# Patient Record
Sex: Male | Born: 1985 | Race: White | Hispanic: No | Marital: Married | Smoking: Never smoker
Health system: Southern US, Community
[De-identification: ages and names within clinical notes are randomized; demographics above are authoritative.]

## PROBLEM LIST (undated history)

## (undated) ENCOUNTER — Ambulatory Visit: Admission: EM | Payer: BC Managed Care – PPO

## (undated) DIAGNOSIS — T7840XA Allergy, unspecified, initial encounter: Secondary | ICD-10-CM

## (undated) DIAGNOSIS — Z8619 Personal history of other infectious and parasitic diseases: Secondary | ICD-10-CM

## (undated) HISTORY — PX: NO PAST SURGERIES: SHX2092

## (undated) HISTORY — DX: Personal history of other infectious and parasitic diseases: Z86.19

## (undated) HISTORY — DX: Allergy, unspecified, initial encounter: T78.40XA

---

## 2013-03-30 ENCOUNTER — Other Ambulatory Visit: Payer: Self-pay | Admitting: Occupational Medicine

## 2013-03-30 ENCOUNTER — Ambulatory Visit: Payer: Self-pay

## 2013-03-30 DIAGNOSIS — Z0000 Encounter for general adult medical examination without abnormal findings: Secondary | ICD-10-CM

## 2015-04-07 IMAGING — CR DG CHEST 1V
1 series · 1 of 1 positions shown · non-contrast
Comparison: None.

CLINICAL DATA: Pre-employment physical examination, nonsmoker,
initial encounter.

CHEST - 1 VIEW

[view not recorded]
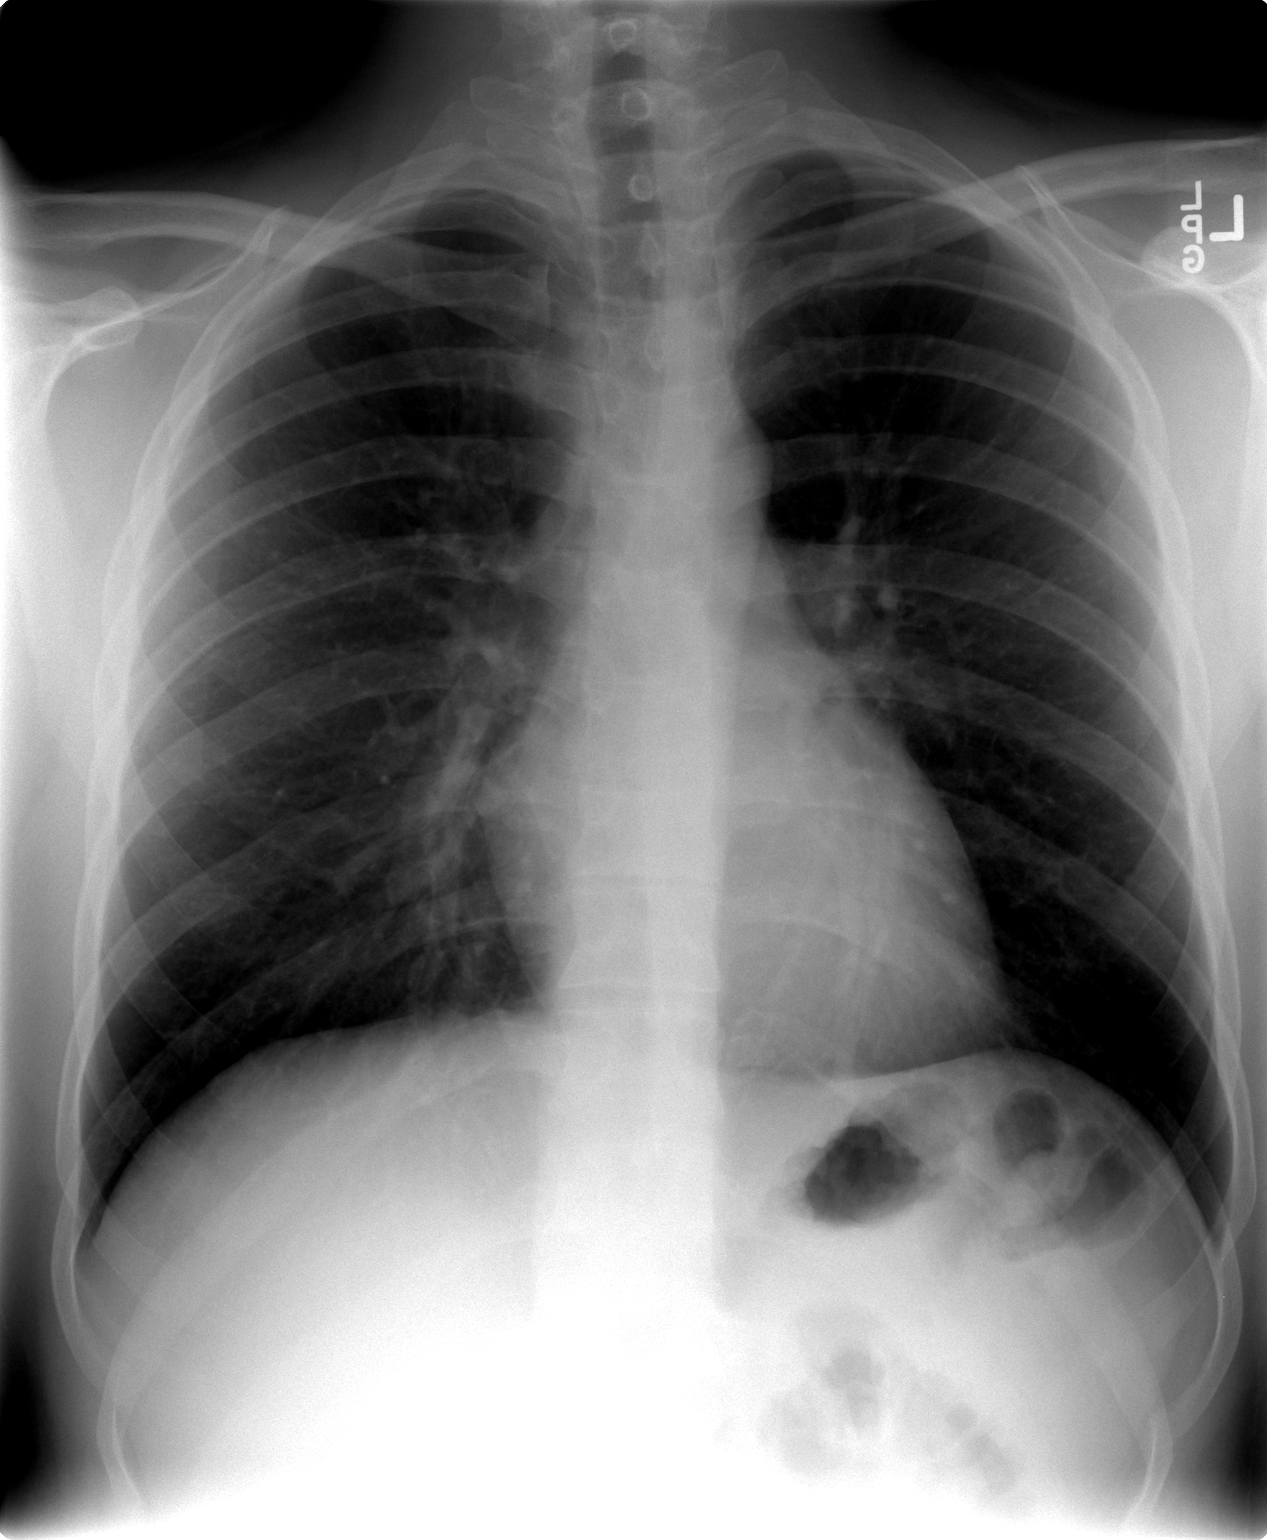

[1 of 1 positions shown; findings below may reference images not displayed]

FINDINGS: Normal cardiac silhouette and mediastinal contours.  No focal
parenchymal opacities.  Pleural effusion pneumothorax.  No evidence
of edema.  No acute osseous abnormalities.
IMPRESSION: Normal PA chest radiograph.

## 2016-08-19 DIAGNOSIS — M9903 Segmental and somatic dysfunction of lumbar region: Secondary | ICD-10-CM | POA: Diagnosis not present

## 2016-08-19 DIAGNOSIS — M9905 Segmental and somatic dysfunction of pelvic region: Secondary | ICD-10-CM | POA: Diagnosis not present

## 2016-08-19 DIAGNOSIS — M791 Myalgia: Secondary | ICD-10-CM | POA: Diagnosis not present

## 2016-08-19 DIAGNOSIS — M9902 Segmental and somatic dysfunction of thoracic region: Secondary | ICD-10-CM | POA: Diagnosis not present

## 2017-10-26 ENCOUNTER — Encounter: Payer: Self-pay | Admitting: Physician Assistant

## 2017-10-26 ENCOUNTER — Other Ambulatory Visit: Payer: Self-pay

## 2017-10-26 ENCOUNTER — Ambulatory Visit: Payer: BLUE CROSS/BLUE SHIELD | Admitting: Physician Assistant

## 2017-10-26 VITALS — BP 130/86 | HR 74 | Temp 98.1°F | Resp 16 | Ht 71.0 in | Wt 197.0 lb

## 2017-10-26 DIAGNOSIS — E010 Iodine-deficiency related diffuse (endemic) goiter: Secondary | ICD-10-CM | POA: Diagnosis not present

## 2017-10-26 DIAGNOSIS — R5383 Other fatigue: Secondary | ICD-10-CM | POA: Diagnosis not present

## 2017-10-26 LAB — CBC WITH DIFFERENTIAL/PLATELET
BASOS ABS: 0 10*3/uL (ref 0.0–0.1)
Basophils Relative: 0.4 % (ref 0.0–3.0)
Eosinophils Absolute: 0 10*3/uL (ref 0.0–0.7)
Eosinophils Relative: 0.9 % (ref 0.0–5.0)
HCT: 43.9 % (ref 39.0–52.0)
Hemoglobin: 14.8 g/dL (ref 13.0–17.0)
Lymphocytes Relative: 38 % (ref 12.0–46.0)
Lymphs Abs: 1.9 10*3/uL (ref 0.7–4.0)
MCHC: 33.7 g/dL (ref 30.0–36.0)
MCV: 83.7 fl (ref 78.0–100.0)
Monocytes Absolute: 0.4 10*3/uL (ref 0.1–1.0)
Monocytes Relative: 7.5 % (ref 3.0–12.0)
NEUTROS PCT: 53.2 % (ref 43.0–77.0)
Neutro Abs: 2.7 10*3/uL (ref 1.4–7.7)
Platelets: 246 10*3/uL (ref 150.0–400.0)
RBC: 5.24 Mil/uL (ref 4.22–5.81)
RDW: 14.2 % (ref 11.5–15.5)
WBC: 5 10*3/uL (ref 4.0–10.5)

## 2017-10-26 LAB — COMPREHENSIVE METABOLIC PANEL
ALT: 15 U/L (ref 0–53)
AST: 19 U/L (ref 0–37)
Albumin: 4.5 g/dL (ref 3.5–5.2)
Alkaline Phosphatase: 48 U/L (ref 39–117)
BUN: 12 mg/dL (ref 6–23)
CO2: 28 mEq/L (ref 19–32)
Calcium: 9.3 mg/dL (ref 8.4–10.5)
Chloride: 103 mEq/L (ref 96–112)
Creatinine, Ser: 1.13 mg/dL (ref 0.40–1.50)
GFR: 80.34 mL/min (ref >60.00)
GLUCOSE: 97 mg/dL (ref 70–99)
POTASSIUM: 4.1 meq/L (ref 3.5–5.1)
Sodium: 137 mEq/L (ref 135–145)
Total Bilirubin: 0.7 mg/dL (ref 0.2–1.2)
Total Protein: 7.1 g/dL (ref 6.0–8.3)

## 2017-10-26 LAB — TSH: TSH: 3.08 u[IU]/mL (ref 0.35–4.50)

## 2017-10-26 LAB — VITAMIN B12: VITAMIN B 12: 529 pg/mL (ref 211–911)

## 2017-10-26 LAB — VITAMIN D 25 HYDROXY (VIT D DEFICIENCY, FRACTURES): VITD: 33.99 ng/mL (ref 30.00–100.00)

## 2017-10-26 NOTE — Patient Instructions (Signed)
Please go to the lab today for blood work.  I will call you with your results. We will alter treatment regimen(s) if indicated by your results.   You will be contacted for a thyroid ultrasound to further assess.  I will call with these results as well.  Keep a well-balanced diet and stay active!  Please schedule an appointment for a complete physical at your earliest convenience.   Welcome to [facility]!

## 2017-10-26 NOTE — Assessment & Plan Note (Signed)
No palpable nodule. Will check TSH today. US thyroid to be obtained.

## 2017-10-26 NOTE — Assessment & Plan Note (Signed)
Unspecified etiology. Exam unremarkable. Will check TSH, CBC, CMP, Vit D and B12. Continue well-balanced diet and stay active. 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

## 2017-10-26 NOTE — Progress Notes (Signed)
Patient presents to clinic today to establish care.  Acute Concerns: Patient is wanting assessment of a potential nodule on his neck/thyroid noticed by the dentist at recent appointmnet.  Denies sore throat, difficulty swallowing or breathing. Denies odynophagia. Notes some fatigue with weight gain. Father with hypothyroidism. Denies dry skin, depressed mood.  Denies constipation.  Past Medical History:  Diagnosis Date  . Allergy   . History of chickenpox     Past Surgical History:  Procedure Laterality Date  . NO PAST SURGERIES      No current outpatient medications on file prior to visit.   No current facility-administered medications on file prior to visit.     No Known Allergies  Family History  Problem Relation Age of Onset  . Autoimmune disease Mother   . Thyroid disease Father   . Healthy Sister   . Cancer Maternal Grandmother   . Cancer Maternal Grandfather        Lung  . COPD Paternal Grandfather     Social History   Socioeconomic History  . Marital status: Married    Spouse name: Not on file  . Number of children: Not on file  . Years of education: Not on file  . Highest education level: Not on file  Occupational History  . Not on file  Social Needs  . Financial resource strain: Not on file  . Food insecurity:    Worry: Not on file    Inability: Not on file  . Transportation needs:    Medical: Not on file    Non-medical: Not on file  Tobacco Use  . Smoking status: Never Smoker  . Smokeless tobacco: Never Used  Substance and Sexual Activity  . Alcohol use: Yes    Frequency: Never    Comment: 4-6 beers twice a week  . Drug use: Never  . Sexual activity: Yes  Lifestyle  . Physical activity:    Days per week: Not on file    Minutes per session: Not on file  . Stress: Not on file  Relationships  . Social connections:    Talks on phone: Not on file    Gets together: Not on file    Attends religious service: Not on file    Active member of  club or organization: Not on file    Attends meetings of clubs or organizations: Not on file    Relationship status: Not on file  . Intimate partner violence:    Fear of current or ex partner: Not on file    Emotionally abused: Not on file    Physically abused: Not on file    Forced sexual activity: Not on file  Other Topics Concern  . Not on file  Social History Narrative  . Not on file   Review of Systems  Constitutional: Positive for malaise/fatigue. Negative for fever and weight loss.  HENT: Negative for ear discharge, ear pain, hearing loss and tinnitus.   Eyes: Negative for blurred vision, double vision, photophobia and pain.  Respiratory: Negative for cough and shortness of breath.   Cardiovascular: Negative for chest pain and palpitations.  Gastrointestinal: Negative for abdominal pain, blood in stool, constipation, diarrhea, heartburn, melena, nausea and vomiting.  Genitourinary: Negative for dysuria, flank pain, frequency, hematuria and urgency.  Musculoskeletal: Negative for falls.  Neurological: Negative for dizziness, loss of consciousness and headaches.  Endo/Heme/Allergies: Negative for environmental allergies.  Psychiatric/Behavioral: Negative for depression, hallucinations, substance abuse and suicidal ideas. The patient is not nervous/anxious and  does not have insomnia.    BP 130/86   Pulse 74   Temp 98.1 F (36.7 C) (Oral)   Resp 16   Ht 5\' 11"  (1.803 m)   Wt 197 lb (89.4 kg)   SpO2 99%   BMI 27.48 kg/m   Physical Exam  Constitutional: He is oriented to person, place, and time. He appears well-developed and well-nourished. No distress.  HENT:  Head: Normocephalic and atraumatic.  Right Ear: Tympanic membrane, external ear and ear canal normal.  Left Ear: Tympanic membrane, external ear and ear canal normal.  Nose: Nose normal.  Mouth/Throat: Oropharynx is clear and moist and mucous membranes are normal. No posterior oropharyngeal edema or posterior  oropharyngeal erythema.  Eyes: Pupils are equal, round, and reactive to light. Conjunctivae are normal.  Neck: Neck supple. Thyromegaly (questionable mild enlargement of inferior thyroid bilaterally and isthmus. No palpable nodule on examination) present.  Cardiovascular: Normal rate, regular rhythm, normal heart sounds and intact distal pulses.  Pulmonary/Chest: Effort normal and breath sounds normal. No respiratory distress. He has no wheezes. He has no rales. He exhibits no tenderness.  Lymphadenopathy:    He has no cervical adenopathy.  Neurological: He is alert and oriented to person, place, and time. No cranial nerve deficit.  Skin: Skin is warm and dry. No rash noted. He is not diaphoretic.  Psychiatric: He has a normal mood and affect.  Vitals reviewed.  Assessment/Plan: Thyromegaly No palpable nodule. Will check TSH today. US thyroid to be obtained.   Fatigue Unspecified etiology. Exam unremarkable. Will check TSH, CBC, CMP, Vit D and B12. Continue well-balanced diet and stay active. 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     Piedad ClimesWilliam Cody Arshawn Valdez, PA-C

## 2017-11-22 ENCOUNTER — Other Ambulatory Visit: Payer: Self-pay | Admitting: Physician Assistant

## 2017-11-22 DIAGNOSIS — E010 Iodine-deficiency related diffuse (endemic) goiter: Secondary | ICD-10-CM

## 2017-12-21 ENCOUNTER — Other Ambulatory Visit: Payer: Self-pay

## 2019-12-14 ENCOUNTER — Ambulatory Visit: Payer: BLUE CROSS/BLUE SHIELD | Admitting: Physician Assistant

## 2019-12-21 ENCOUNTER — Ambulatory Visit: Payer: BC Managed Care – PPO | Admitting: Physician Assistant

## 2019-12-21 DIAGNOSIS — Z0289 Encounter for other administrative examinations: Secondary | ICD-10-CM

## 2021-07-15 ENCOUNTER — Ambulatory Visit: Payer: BC Managed Care – PPO | Admitting: Registered Nurse

## 2021-08-26 ENCOUNTER — Encounter: Payer: BC Managed Care – PPO | Admitting: Registered Nurse
